# Patient Record
Sex: Female | Born: 1985 | Race: Black or African American | Hispanic: No | Marital: Single | Smoking: Never smoker
Health system: Southern US, Community
[De-identification: ages and names within clinical notes are randomized; demographics above are authoritative.]

## PROBLEM LIST (undated history)

## (undated) DIAGNOSIS — D649 Anemia, unspecified: Secondary | ICD-10-CM

## (undated) HISTORY — PX: EAR CYST EXCISION: SHX22

---

## 2005-03-30 ENCOUNTER — Other Ambulatory Visit: Admission: RE | Admit: 2005-03-30 | Discharge: 2005-03-30 | Payer: Self-pay | Admitting: Family Medicine

## 2006-06-23 ENCOUNTER — Other Ambulatory Visit: Admission: RE | Admit: 2006-06-23 | Discharge: 2006-06-23 | Payer: Self-pay | Admitting: Family Medicine

## 2007-07-10 ENCOUNTER — Other Ambulatory Visit: Admission: RE | Admit: 2007-07-10 | Discharge: 2007-07-10 | Payer: Self-pay | Admitting: Family Medicine

## 2008-09-08 ENCOUNTER — Other Ambulatory Visit: Admission: RE | Admit: 2008-09-08 | Discharge: 2008-09-08 | Payer: Self-pay | Admitting: Family Medicine

## 2009-10-13 ENCOUNTER — Other Ambulatory Visit: Admission: RE | Admit: 2009-10-13 | Discharge: 2009-10-13 | Payer: Self-pay | Admitting: Family Medicine

## 2010-05-12 ENCOUNTER — Other Ambulatory Visit: Admission: RE | Admit: 2010-05-12 | Discharge: 2010-05-12 | Payer: Self-pay | Admitting: Family Medicine

## 2010-10-14 ENCOUNTER — Other Ambulatory Visit: Payer: Self-pay | Admitting: Physician Assistant

## 2010-10-14 ENCOUNTER — Other Ambulatory Visit (HOSPITAL_COMMUNITY)
Admission: RE | Admit: 2010-10-14 | Discharge: 2010-10-14 | Disposition: A | Discharge status: Home / Self Care | Payer: PRIVATE HEALTH INSURANCE | Source: Ambulatory Visit | Attending: Advanced Practice Midwife | Admitting: Advanced Practice Midwife

## 2010-10-14 DIAGNOSIS — Z01419 Encounter for gynecological examination (general) (routine) without abnormal findings: Secondary | ICD-10-CM | POA: Insufficient documentation

## 2011-05-17 ENCOUNTER — Inpatient Hospital Stay (INDEPENDENT_AMBULATORY_CARE_PROVIDER_SITE_OTHER)
Admission: RE | Admit: 2011-05-17 | Discharge: 2011-05-17 | Disposition: A | Discharge status: Home / Self Care | Payer: PRIVATE HEALTH INSURANCE | Source: Ambulatory Visit | Attending: Family Medicine | Admitting: Family Medicine

## 2011-05-17 ENCOUNTER — Ambulatory Visit (INDEPENDENT_AMBULATORY_CARE_PROVIDER_SITE_OTHER): Payer: PRIVATE HEALTH INSURANCE

## 2011-05-17 DIAGNOSIS — M546 Pain in thoracic spine: Secondary | ICD-10-CM

## 2012-07-02 ENCOUNTER — Ambulatory Visit: Payer: PRIVATE HEALTH INSURANCE

## 2012-07-10 ENCOUNTER — Other Ambulatory Visit (HOSPITAL_COMMUNITY)
Admission: RE | Admit: 2012-07-10 | Discharge: 2012-07-10 | Disposition: A | Discharge status: Home / Self Care | Payer: No Typology Code available for payment source | Source: Ambulatory Visit | Attending: Family Medicine | Admitting: Family Medicine

## 2012-07-10 ENCOUNTER — Other Ambulatory Visit: Payer: Self-pay | Admitting: Physician Assistant

## 2012-07-10 DIAGNOSIS — Z124 Encounter for screening for malignant neoplasm of cervix: Secondary | ICD-10-CM | POA: Insufficient documentation

## 2012-07-12 ENCOUNTER — Telehealth: Payer: Self-pay

## 2012-07-12 NOTE — Telephone Encounter (Signed)
Immunizations ready for pickup. Patient notified.

## 2012-07-12 NOTE — Telephone Encounter (Signed)
Pt requesting a copy of her immunization records and would like MR to contact her when ready for pick-up. [phone]

## 2012-09-02 IMAGING — CR DG CHEST 2V
2 series · 2 of 2 positions shown · non-contrast
Comparison: None.

CLINICAL DATA: Chest pain

CHEST - 2 VIEW

[view not recorded (1 of 2)]
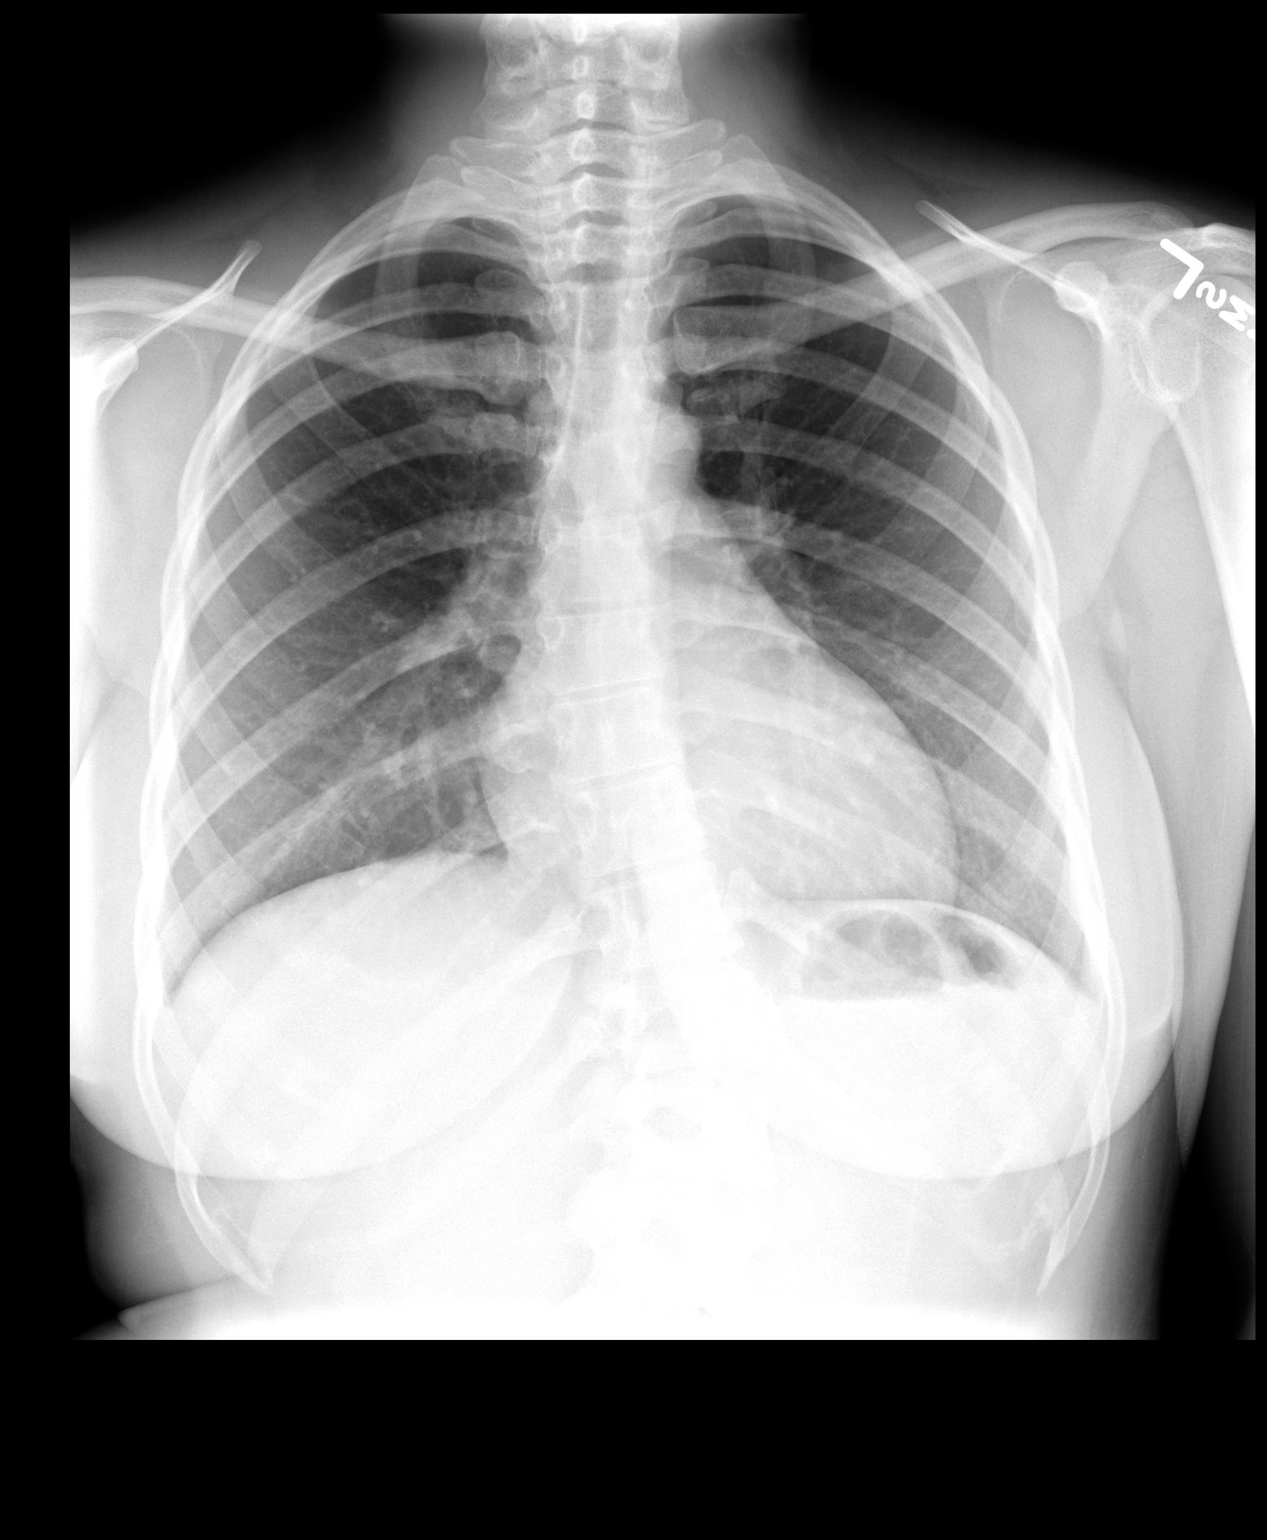

[view not recorded (2 of 2)]
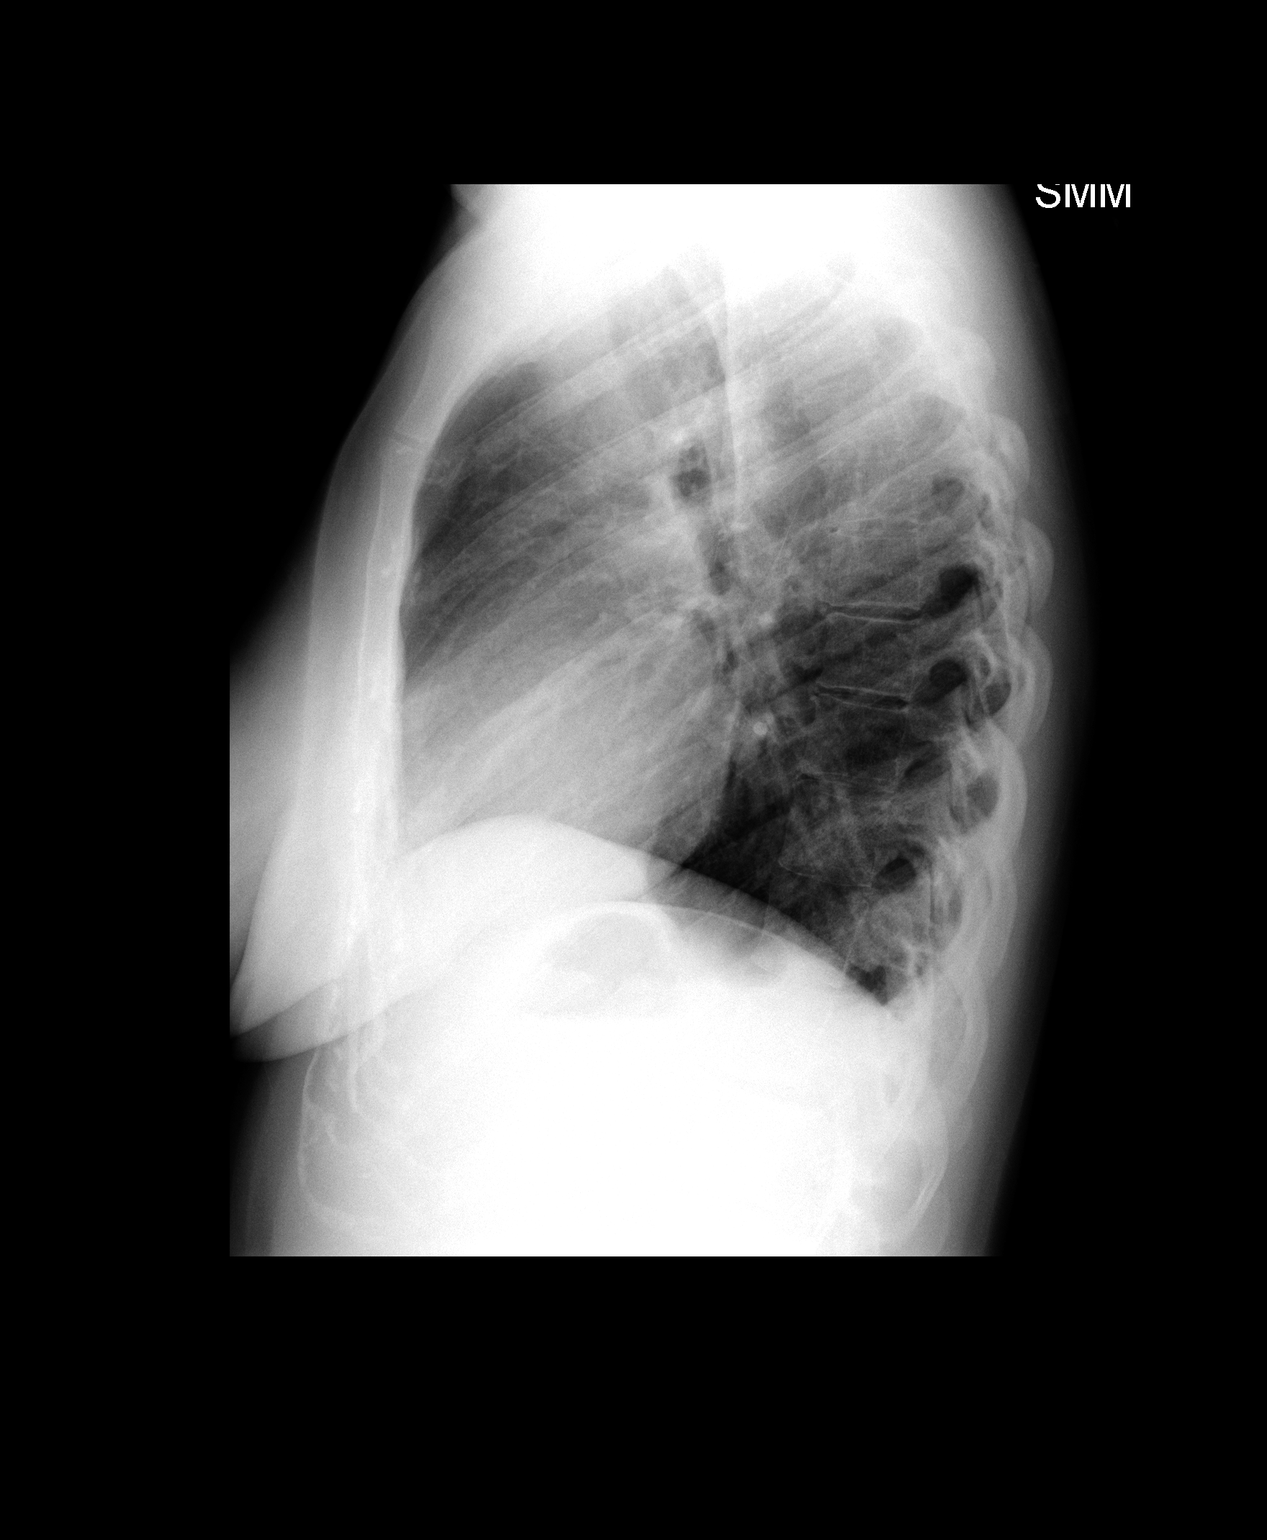

[2 of 2 positions shown; findings below may reference images not displayed]

FINDINGS: Lungs are clear. No pleural effusion or pneumothorax.

The heart is top normal in size.

Mild curvature of the thoracolumbar spine.
IMPRESSION: Normal chest radiographs.

## 2013-07-12 ENCOUNTER — Other Ambulatory Visit (HOSPITAL_COMMUNITY)
Admission: RE | Admit: 2013-07-12 | Discharge: 2013-07-12 | Disposition: A | Discharge status: Home / Self Care | Payer: PRIVATE HEALTH INSURANCE | Source: Ambulatory Visit | Attending: Family Medicine | Admitting: Family Medicine

## 2013-07-12 ENCOUNTER — Other Ambulatory Visit: Payer: Self-pay | Admitting: Physician Assistant

## 2013-07-12 DIAGNOSIS — Z124 Encounter for screening for malignant neoplasm of cervix: Secondary | ICD-10-CM | POA: Insufficient documentation

## 2014-07-17 ENCOUNTER — Other Ambulatory Visit (HOSPITAL_COMMUNITY)
Admission: RE | Admit: 2014-07-17 | Discharge: 2014-07-17 | Disposition: A | Discharge status: Home / Self Care | Payer: 59 | Source: Ambulatory Visit | Attending: Physician Assistant | Admitting: Physician Assistant

## 2014-07-17 ENCOUNTER — Other Ambulatory Visit: Payer: Self-pay | Admitting: Physician Assistant

## 2014-07-17 DIAGNOSIS — Z124 Encounter for screening for malignant neoplasm of cervix: Secondary | ICD-10-CM | POA: Insufficient documentation

## 2014-07-17 DIAGNOSIS — Z1151 Encounter for screening for human papillomavirus (HPV): Secondary | ICD-10-CM | POA: Diagnosis present

## 2014-07-21 LAB — CYTOLOGY - PAP

## 2015-07-21 ENCOUNTER — Other Ambulatory Visit: Payer: Self-pay | Admitting: Physician Assistant

## 2015-07-21 ENCOUNTER — Other Ambulatory Visit (HOSPITAL_COMMUNITY)
Admission: RE | Admit: 2015-07-21 | Discharge: 2015-07-21 | Disposition: A | Discharge status: Home / Self Care | Payer: 59 | Source: Ambulatory Visit | Attending: Family Medicine | Admitting: Family Medicine

## 2015-07-21 DIAGNOSIS — Z124 Encounter for screening for malignant neoplasm of cervix: Secondary | ICD-10-CM | POA: Insufficient documentation

## 2015-07-23 LAB — CYTOLOGY - PAP

## 2016-07-21 ENCOUNTER — Other Ambulatory Visit (HOSPITAL_COMMUNITY)
Admission: RE | Admit: 2016-07-21 | Discharge: 2016-07-21 | Disposition: A | Discharge status: Home / Self Care | Payer: BC Managed Care – PPO | Source: Ambulatory Visit | Attending: Family Medicine | Admitting: Family Medicine

## 2016-07-21 ENCOUNTER — Other Ambulatory Visit: Payer: Self-pay | Admitting: Physician Assistant

## 2016-07-21 DIAGNOSIS — Z124 Encounter for screening for malignant neoplasm of cervix: Secondary | ICD-10-CM | POA: Insufficient documentation

## 2016-07-22 LAB — CYTOLOGY - PAP: Diagnosis: NEGATIVE

## 2019-07-29 ENCOUNTER — Other Ambulatory Visit: Payer: Self-pay | Admitting: Physician Assistant

## 2019-07-29 ENCOUNTER — Other Ambulatory Visit (HOSPITAL_COMMUNITY)
Admission: RE | Admit: 2019-07-29 | Discharge: 2019-07-29 | Disposition: A | Discharge status: Home / Self Care | Payer: BC Managed Care – PPO | Source: Ambulatory Visit | Attending: Physician Assistant | Admitting: Physician Assistant

## 2019-07-29 DIAGNOSIS — Z124 Encounter for screening for malignant neoplasm of cervix: Secondary | ICD-10-CM | POA: Insufficient documentation

## 2019-07-31 LAB — CYTOLOGY - PAP: Diagnosis: NEGATIVE

## 2021-11-09 NOTE — H&P (Incomplete)
Kristi Mullins is an 36 y.o. No obstetric history on file. presenting for ***  There are no problems to display for this patient.   Pertinent Gynecological History: Menses: {menses:16152} Bleeding: {uterine bleeding:32112} Contraception: {contraception:5051} Sexually transmitted diseases: {std risk:32110} Previous GYN Procedures: {previous procedures:3041388}  Last mammogram: {normal/abnormal***:32111} Date: *** Last pap: {normal/abnormal***:32111} Date: *** OB History: No obstetric history on file.   MEDICAL/FAMILY/SOCIAL HX: No LMP recorded.    No past medical history on file.  *** The histories are not reviewed yet. Please review them in the "History" navigator section and refresh this [place].  No family history on file.  Social History:  has no history on file for tobacco use, alcohol use, and drug use.  ALLERGIES/MEDS:  Allergies: Not on File  No medications prior to admission.     Review of Systems  Constitutional: Negative.   HENT: Negative.    Eyes: Negative.   Respiratory: Negative.    Cardiovascular: Negative.   Gastrointestinal: Negative.   Genitourinary: Negative.   Musculoskeletal: Negative.   Skin: Negative.   Neurological: Negative.   Endo/Heme/Allergies: Negative.   Psychiatric/Behavioral: Negative.     There were no vitals taken for this visit. Physical Exam  No results found for this or any previous visit (from the past 24 hour(s)).  No results found.   ASSESSMENT/PLAN: Kristi Mullins is a 36 y.o. No obstetric history on file. who is admitted for [facility], DO [phone] (office)

## 2021-11-18 NOTE — Pre-Procedure Instructions (Signed)
Surgical Instructions ? ? ? Your procedure is scheduled on Tuesday 11/23/21. ? ? Report to [facility] Main Entrance "A" at 05:30 A.M., then check in with the Admitting office. ? Call this number if you have problems the morning of surgery: ? [phone] ? ? If you have any questions prior to your surgery date call [phone]: Open Monday-Friday 8am-4pm ? ? ? Remember: ? Do not eat or drink after midnight the night before your surgery ?  ? Take these medicines the morning of surgery with A SIP OF WATER:  ? NONE ? ?As of today, STOP taking any Aspirin (unless otherwise instructed by your surgeon) Aleve, Naproxen, Ibuprofen, Motrin, Advil, Goody's, BC's, all herbal medications, fish oil, and all vitamins. ? ?         ?Do not wear jewelry or makeup ?Do not wear lotions, powders, perfumes/colognes, or deodorant. ?Do not shave 48 hours prior to surgery.  Men may shave face and neck. ?Do not bring valuables to the hospital. ?Do not wear nail polish, gel polish, artificial nails, or any other type of covering on natural nails (fingers and toes) ?If you have artificial nails or gel coating that need to be removed by a nail salon, please have this removed prior to surgery. Artificial nails or gel coating may interfere with anesthesia's ability to adequately monitor your vital signs. ? ?[facility] is not responsible for any belongings or valuables. .  ? ?Do NOT Smoke (Tobacco/Vaping)  24 hours prior to your procedure ? ?If you use a CPAP at night, you may bring your mask for your overnight stay. ?  ?Contacts, glasses, hearing aids, dentures or partials may not be worn into surgery, please bring cases for these belongings ?  ?For patients admitted to the hospital, discharge time will be determined by your treatment team. ?  ?Patients discharged the day of surgery will not be allowed to drive home, and someone needs to stay with them for 24 hours. ? ?NO VISITORS WILL BE ALLOWED IN PRE-OP WHERE PATIENTS ARE PREPPED FOR  SURGERY.  ONLY 1 SUPPORT PERSON MAY BE PRESENT IN THE WAITING ROOM WHILE YOU ARE IN SURGERY.  IF YOU ARE TO BE ADMITTED, ONCE YOU ARE IN YOUR ROOM YOU WILL BE ALLOWED TWO (2) VISITORS. 1 (ONE) VISITOR MAY STAY OVERNIGHT BUT MUST ARRIVE TO THE ROOM BY 8pm.  Minor children may have two parents present. Special consideration for safety and communication needs will be reviewed on a case by case basis. ? ?Special instructions:   ? ?Oral Hygiene is also important to reduce your risk of infection.  Remember - BRUSH YOUR TEETH THE MORNING OF SURGERY WITH YOUR REGULAR TOOTHPASTE ? ? ?[facility]- Preparing For Surgery ? ?Before surgery, you can play an important role. Because skin is not sterile, your skin needs to be as free of germs as possible. You can reduce the number of germs on your skin by washing with CHG (chlorahexidine gluconate) Soap before surgery.  CHG is an antiseptic cleaner which kills germs and bonds with the skin to continue killing germs even after washing.   ? ? ?Please do not use if you have an allergy to CHG or antibacterial soaps. If your skin becomes reddened/irritated stop using the CHG.  ?Do not shave (including legs and underarms) for at least 48 hours prior to first CHG shower. It is OK to shave your face. ? ?Please follow these instructions carefully. ?  ? ? Shower the [employer] SURGERY and the  MORNING OF SURGERY with CHG Soap.  ? If you chose to wash your hair, wash your hair first as usual with your normal shampoo. After you shampoo, rinse your hair and body thoroughly to remove the shampoo.  Then [employer] and genitals (private parts) with your normal soap and rinse thoroughly to remove soap. ? ?After that Use CHG Soap as you would any other liquid soap. You can apply CHG directly to the skin and wash gently with a scrungie or a clean washcloth.  ? ?Apply the CHG Soap to your body ONLY FROM THE NECK DOWN.  Do not use on open wounds or open sores. Avoid contact with your eyes, ears, mouth  and genitals (private parts). Wash Face and genitals (private parts)  with your normal soap.  ? ?Wash thoroughly, paying special attention to the area where your surgery will be performed. ? ?Thoroughly rinse your body with warm water from the neck down. ? ?DO NOT shower/wash with your normal soap after using and rinsing off the CHG Soap. ? ?Pat yourself dry with a CLEAN TOWEL. ? ?Wear CLEAN PAJAMAS to bed the night before surgery ? ?Place CLEAN SHEETS on your bed the night before your surgery ? ?DO NOT SLEEP WITH PETS. ? ? ?Day of Surgery: ? ?Take a shower with CHG soap. ?Wear Clean/Comfortable clothing the morning of surgery ?Do not apply any deodorants/lotions.   ?Remember to brush your teeth WITH YOUR REGULAR TOOTHPASTE. ? ? ? ?COVID testing ? ?If you are going to stay overnight or be admitted after your procedure/surgery and require a pre-op COVID test, please follow these instructions after your COVID test  ? ?You are not required to quarantine however you are required to wear a well-fitting mask when you are out and around people not in your household.  If your mask becomes wet or soiled, replace with a new one. ? ?Wash your hands often with soap and water for 20 seconds or clean your hands with an alcohol-based hand sanitizer that contains at least 60% alcohol. ? ?Do not share personal items. ? ?Notify your provider: ?if you are in close contact with someone who has COVID  ?or if you develop a fever of 100.4 or greater, sneezing, cough, sore throat, shortness of breath or body aches. ? ?  ?Please read over the following fact sheets that you were given.  ? ?

## 2021-11-19 ENCOUNTER — Encounter (HOSPITAL_COMMUNITY)
Admission: RE | Admit: 2021-11-19 | Discharge: 2021-11-19 | Disposition: A | Discharge status: Home / Self Care | Payer: BC Managed Care – PPO | Source: Ambulatory Visit | Attending: Obstetrics and Gynecology | Admitting: Obstetrics and Gynecology

## 2021-11-19 ENCOUNTER — Other Ambulatory Visit: Payer: Self-pay

## 2021-11-19 ENCOUNTER — Encounter (HOSPITAL_COMMUNITY): Payer: Self-pay

## 2021-11-19 DIAGNOSIS — D250 Submucous leiomyoma of uterus: Secondary | ICD-10-CM | POA: Diagnosis not present

## 2021-11-19 DIAGNOSIS — D251 Intramural leiomyoma of uterus: Secondary | ICD-10-CM | POA: Insufficient documentation

## 2021-11-19 DIAGNOSIS — Z01812 Encounter for preprocedural laboratory examination: Secondary | ICD-10-CM | POA: Insufficient documentation

## 2021-11-19 HISTORY — DX: Anemia, unspecified: D64.9

## 2021-11-19 LAB — CBC
HCT: 33.2 % — ABNORMAL LOW (ref 36.0–46.0)
Hemoglobin: 10.5 g/dL — ABNORMAL LOW (ref 12.0–15.0)
MCH: 27.2 pg (ref 26.0–34.0)
MCHC: 31.6 g/dL (ref 30.0–36.0)
MCV: 86 fL (ref 80.0–100.0)
Platelets: 327 10*3/uL (ref 150–400)
RBC: 3.86 MIL/uL — ABNORMAL LOW (ref 3.87–5.11)
RDW: 14.7 % (ref 11.5–15.5)
WBC: 5.7 10*3/uL (ref 4.0–10.5)
nRBC: 0 % (ref 0.0–0.2)

## 2021-11-19 LAB — TYPE AND SCREEN
ABO/RH(D): O POS
Antibody Screen: NEGATIVE

## 2021-11-19 NOTE — Pre-Procedure Instructions (Signed)
Surgical Instructions ? ? ? Your procedure is scheduled on Tuesday 11/23/21. ? ? Report to [facility] Main Entrance "A" at 05:30 A.M., then check in with the Admitting office. ? Call this number if you have problems the morning of surgery: ? [phone] ? ? If you have any questions prior to your surgery date call [phone]: Open Monday-Friday 8am-4pm ? ? ? Remember: ? Do not eat or drink after midnight the night before your surgery ?  ? Take these medicines the morning of surgery with A SIP OF WATER:  ? ?Sprintec ? ?As of today, STOP taking any Aspirin (unless otherwise instructed by your surgeon) Aleve, Naproxen, Ibuprofen, Motrin, Advil, Goody's, BC's, all herbal medications, fish oil, and all vitamins. ? ?         ?Do not wear jewelry or makeup ?Do not wear lotions, powders, perfumes/colognes, or deodorant. ?Do not shave 48 hours prior to surgery.  Men may shave face and neck. ?Do not bring valuables to the hospital. ?Do not wear nail polish, gel polish, artificial nails, or any other type of covering on natural nails (fingers and toes) ?If you have artificial nails or gel coating that need to be removed by a nail salon, please have this removed prior to surgery. Artificial nails or gel coating may interfere with anesthesia's ability to adequately monitor your vital signs. ? ?[facility] is not responsible for any belongings or valuables. .  ? ?Do NOT Smoke (Tobacco/Vaping)  24 hours prior to your procedure ? ?If you use a CPAP at night, you may bring your mask for your overnight stay. ?  ?Contacts, glasses, hearing aids, dentures or partials may not be worn into surgery, please bring cases for these belongings ?  ?For patients admitted to the hospital, discharge time will be determined by your treatment team. ?  ?Patients discharged the day of surgery will not be allowed to drive home, and someone needs to stay with them for 24 hours. ? ?NO VISITORS WILL BE ALLOWED IN PRE-OP WHERE PATIENTS ARE PREPPED FOR  SURGERY.  ONLY 1 SUPPORT PERSON MAY BE PRESENT IN THE WAITING ROOM WHILE YOU ARE IN SURGERY.  IF YOU ARE TO BE ADMITTED, ONCE YOU ARE IN YOUR ROOM YOU WILL BE ALLOWED TWO (2) VISITORS. 1 (ONE) VISITOR MAY STAY OVERNIGHT BUT MUST ARRIVE TO THE ROOM BY 8pm.  Minor children may have two parents present. Special consideration for safety and communication needs will be reviewed on a case by case basis. ? ?Special instructions:   ? ?Oral Hygiene is also important to reduce your risk of infection.  Remember - BRUSH YOUR TEETH THE MORNING OF SURGERY WITH YOUR REGULAR TOOTHPASTE ? ? ?[facility]- Preparing For Surgery ? ?Before surgery, you can play an important role. Because skin is not sterile, your skin needs to be as free of germs as possible. You can reduce the number of germs on your skin by washing with CHG (chlorahexidine gluconate) Soap before surgery.  CHG is an antiseptic cleaner which kills germs and bonds with the skin to continue killing germs even after washing.   ? ? ?Please do not use if you have an allergy to CHG or antibacterial soaps. If your skin becomes reddened/irritated stop using the CHG.  ?Do not shave (including legs and underarms) for at least 48 hours prior to first CHG shower. It is OK to shave your face. ? ?Please follow these instructions carefully. ?  ? ? Shower the [employer] SURGERY and the  MORNING OF SURGERY with CHG Soap.  ? If you chose to wash your hair, wash your hair first as usual with your normal shampoo. After you shampoo, rinse your hair and body thoroughly to remove the shampoo.  Then [employer] and genitals (private parts) with your normal soap and rinse thoroughly to remove soap. ? ?After that Use CHG Soap as you would any other liquid soap. You can apply CHG directly to the skin and wash gently with a scrungie or a clean washcloth.  ? ?Apply the CHG Soap to your body ONLY FROM THE NECK DOWN.  Do not use on open wounds or open sores. Avoid contact with your eyes, ears, mouth  and genitals (private parts). Wash Face and genitals (private parts)  with your normal soap.  ? ?Wash thoroughly, paying special attention to the area where your surgery will be performed. ? ?Thoroughly rinse your body with warm water from the neck down. ? ?DO NOT shower/wash with your normal soap after using and rinsing off the CHG Soap. ? ?Pat yourself dry with a CLEAN TOWEL. ? ?Wear CLEAN PAJAMAS to bed the night before surgery ? ?Place CLEAN SHEETS on your bed the night before your surgery ? ?DO NOT SLEEP WITH PETS. ? ? ?Day of Surgery: ? ?Take a shower with CHG soap. ?Wear Clean/Comfortable clothing the morning of surgery ?Do not apply any deodorants/lotions.   ?Remember to brush your teeth WITH YOUR REGULAR TOOTHPASTE. ? ? ? ?COVID testing ? ?If you are going to stay overnight or be admitted after your procedure/surgery and require a pre-op COVID test, please follow these instructions after your COVID test  ? ?You are not required to quarantine however you are required to wear a well-fitting mask when you are out and around people not in your household.  If your mask becomes wet or soiled, replace with a new one. ? ?Wash your hands often with soap and water for 20 seconds or clean your hands with an alcohol-based hand sanitizer that contains at least 60% alcohol. ? ?Do not share personal items. ? ?Notify your provider: ?if you are in close contact with someone who has COVID  ?or if you develop a fever of 100.4 or greater, sneezing, cough, sore throat, shortness of breath or body aches. ? ?  ?Please read over the following fact sheets that you were given.  ? ?

## 2021-11-19 NOTE — Progress Notes (Signed)
PCP - Dr. Lennie Odor with [facility] ?Cardiologist - Denies ? ?PPM/ICD - Denies ? ?Chest x-ray - N/A ?EKG - N/A ?Stress Test - Denies ?ECHO - Denies ?Cardiac Cath - Denies ? ?Sleep Study - Denies ? ?Patient denies having diabetes. ? ?Blood Thinner Instructions: N/A ?Aspirin Instructions: N/A ? ?ERAS Protcol - No ? ?COVID TEST- N/A ? ? ?Anesthesia review: No ? ?Patient denies shortness of breath, fever, cough and chest pain at PAT appointment ? ? ?All instructions explained to the patient, with a verbal understanding of the material. Patient agrees to go over the instructions while at home for a better understanding. Patient also instructed to self quarantine after being tested for COVID-19. The opportunity to ask questions was provided. ? ? ?

## 2021-11-21 DIAGNOSIS — N939 Abnormal uterine and vaginal bleeding, unspecified: Secondary | ICD-10-CM

## 2021-11-21 DIAGNOSIS — D259 Leiomyoma of uterus, unspecified: Secondary | ICD-10-CM

## 2021-11-21 DIAGNOSIS — E669 Obesity, unspecified: Secondary | ICD-10-CM

## 2021-11-21 DIAGNOSIS — D509 Iron deficiency anemia, unspecified: Secondary | ICD-10-CM

## 2021-11-23 ENCOUNTER — Observation Stay (HOSPITAL_COMMUNITY): Payer: BC Managed Care – PPO | Admitting: Certified Registered Nurse Anesthetist

## 2021-11-23 ENCOUNTER — Encounter (HOSPITAL_COMMUNITY): Payer: Self-pay | Admitting: Obstetrics and Gynecology

## 2021-11-23 ENCOUNTER — Other Ambulatory Visit: Payer: Self-pay

## 2021-11-23 ENCOUNTER — Inpatient Hospital Stay (HOSPITAL_COMMUNITY)
Admission: RE | Admit: 2021-11-23 | Discharge: 2021-11-24 | DRG: 743 | Disposition: A | Discharge status: Home / Self Care | Payer: BC Managed Care – PPO | Attending: Obstetrics and Gynecology | Admitting: Obstetrics and Gynecology

## 2021-11-23 ENCOUNTER — Encounter (HOSPITAL_COMMUNITY)
Admission: RE | Disposition: A | Discharge status: Home / Self Care | Payer: Self-pay | Source: Home / Self Care | Attending: Obstetrics and Gynecology

## 2021-11-23 DIAGNOSIS — N854 Malposition of uterus: Secondary | ICD-10-CM | POA: Diagnosis present

## 2021-11-23 DIAGNOSIS — D259 Leiomyoma of uterus, unspecified: Secondary | ICD-10-CM

## 2021-11-23 DIAGNOSIS — D250 Submucous leiomyoma of uterus: Principal | ICD-10-CM

## 2021-11-23 DIAGNOSIS — N939 Abnormal uterine and vaginal bleeding, unspecified: Secondary | ICD-10-CM

## 2021-11-23 DIAGNOSIS — D251 Intramural leiomyoma of uterus: Secondary | ICD-10-CM

## 2021-11-23 DIAGNOSIS — D509 Iron deficiency anemia, unspecified: Secondary | ICD-10-CM

## 2021-11-23 DIAGNOSIS — E669 Obesity, unspecified: Secondary | ICD-10-CM | POA: Diagnosis present

## 2021-11-23 DIAGNOSIS — Z6835 Body mass index (BMI) 35.0-35.9, adult: Secondary | ICD-10-CM

## 2021-11-23 HISTORY — PX: MYOMECTOMY: SHX85

## 2021-11-23 LAB — POCT PREGNANCY, URINE: Preg Test, Ur: NEGATIVE

## 2021-11-23 LAB — ABO/RH: ABO/RH(D): O POS

## 2021-11-23 SURGERY — MYOMECTOMY, ABDOMINAL APPROACH
Anesthesia: General | Site: Abdomen

## 2021-11-23 MED ORDER — ONDANSETRON HCL 4 MG PO TABS
4.0000 mg | ORAL_TABLET | Freq: Four times a day (QID) | ORAL | Status: DC | PRN
  Administered 2021-11-24: 4 mg via ORAL
  Filled 2021-11-23: qty 1

## 2021-11-23 MED ORDER — ORAL CARE MOUTH RINSE: 15.0000 mL | Freq: Once | OROMUCOSAL | Status: AC

## 2021-11-23 MED ORDER — HYDROMORPHONE HCL 1 MG/ML IJ SOLN
INTRAMUSCULAR | Status: AC
  Filled 2021-11-23: qty 1

## 2021-11-23 MED ORDER — FENTANYL CITRATE (PF) 250 MCG/5ML IJ SOLN
INTRAMUSCULAR | Status: AC
  Filled 2021-11-23: qty 5

## 2021-11-23 MED ORDER — EPHEDRINE 5 MG/ML INJ
INTRAVENOUS | Status: AC
  Filled 2021-11-23: qty 5

## 2021-11-23 MED ORDER — MORPHINE SULFATE (PF) 2 MG/ML IV SOLN
1.0000 mg | INTRAVENOUS | Status: DC | PRN
  Administered 2021-11-23 (×2): 1 mg via INTRAVENOUS
  Filled 2021-11-23 (×2): qty 1

## 2021-11-23 MED ORDER — OXYCODONE HCL 5 MG PO TABS
10.0000 mg | ORAL_TABLET | ORAL | Status: DC
  Administered 2021-11-23 – 2021-11-24 (×3): 10 mg via ORAL
  Filled 2021-11-23 (×3): qty 2

## 2021-11-23 MED ORDER — DEXMEDETOMIDINE (PRECEDEX) IN NS 20 MCG/5ML (4 MCG/ML) IV SYRINGE
PREFILLED_SYRINGE | INTRAVENOUS | Status: DC | PRN
  Administered 2021-11-23: 8 ug via INTRAVENOUS
  Administered 2021-11-23: 4 ug via INTRAVENOUS

## 2021-11-23 MED ORDER — FENTANYL CITRATE (PF) 100 MCG/2ML IJ SOLN
INTRAMUSCULAR | Status: DC | PRN
  Administered 2021-11-23 (×5): 50 ug via INTRAVENOUS

## 2021-11-23 MED ORDER — OXYCODONE HCL 5 MG PO TABS
5.0000 mg | ORAL_TABLET | Freq: Once | ORAL | Status: AC | PRN
  Administered 2021-11-23: 5 mg via ORAL

## 2021-11-23 MED ORDER — LIDOCAINE 2% (20 MG/ML) 5 ML SYRINGE
INTRAMUSCULAR | Status: AC
  Filled 2021-11-23: qty 5

## 2021-11-23 MED ORDER — KETOROLAC TROMETHAMINE 30 MG/ML IJ SOLN
INTRAMUSCULAR | Status: AC
  Filled 2021-11-23: qty 1

## 2021-11-23 MED ORDER — OXYCODONE HCL 5 MG PO TABS
5.0000 mg | ORAL_TABLET | ORAL | Status: DC | PRN
  Administered 2021-11-23: 10 mg via ORAL
  Filled 2021-11-23: qty 2

## 2021-11-23 MED ORDER — IBUPROFEN 600 MG PO TABS
600.0000 mg | ORAL_TABLET | Freq: Four times a day (QID) | ORAL | Status: DC
  Administered 2021-11-23 – 2021-11-24 (×3): 600 mg via ORAL
  Filled 2021-11-23 (×3): qty 1

## 2021-11-23 MED ORDER — ROCURONIUM BROMIDE 10 MG/ML (PF) SYRINGE
PREFILLED_SYRINGE | INTRAVENOUS | Status: AC
  Filled 2021-11-23: qty 10

## 2021-11-23 MED ORDER — VASOPRESSIN 20 UNIT/ML IV SOLN
INTRAVENOUS | Status: DC | PRN
  Administered 2021-11-23: 101 mL via INTRAMUSCULAR

## 2021-11-23 MED ORDER — MEPERIDINE HCL 25 MG/ML IJ SOLN: 6.2500 mg | INTRAMUSCULAR | Status: DC | PRN

## 2021-11-23 MED ORDER — NORGESTIMATE-ETH ESTRADIOL 0.25-35 MG-MCG PO TABS
1.0000 | ORAL_TABLET | Freq: Every day | ORAL | Status: DC
  Administered 2021-11-23: 1 via ORAL

## 2021-11-23 MED ORDER — SODIUM CHLORIDE (PF) 0.9 % IJ SOLN
INTRAMUSCULAR | Status: AC
  Filled 2021-11-23: qty 50

## 2021-11-23 MED ORDER — LACTATED RINGERS IV SOLN: INTRAVENOUS | Status: DC

## 2021-11-23 MED ORDER — PROMETHAZINE HCL 25 MG/ML IJ SOLN: 6.2500 mg | INTRAMUSCULAR | Status: DC | PRN

## 2021-11-23 MED ORDER — OXYCODONE HCL 5 MG PO TABS
ORAL_TABLET | ORAL | Status: AC
  Filled 2021-11-23: qty 1

## 2021-11-23 MED ORDER — DEXAMETHASONE SODIUM PHOSPHATE 10 MG/ML IJ SOLN
INTRAMUSCULAR | Status: DC | PRN
Start: 2021-11-23 — End: 2021-11-23
  Administered 2021-11-23: 10 mg via INTRAVENOUS

## 2021-11-23 MED ORDER — ONDANSETRON HCL 4 MG/2ML IJ SOLN
4.0000 mg | Freq: Four times a day (QID) | INTRAMUSCULAR | Status: DC | PRN
  Administered 2021-11-24: 4 mg via INTRAVENOUS
  Filled 2021-11-23: qty 2

## 2021-11-23 MED ORDER — MIDAZOLAM HCL 2 MG/2ML IJ SOLN
INTRAMUSCULAR | Status: DC | PRN
  Administered 2021-11-23: 2 mg via INTRAVENOUS

## 2021-11-23 MED ORDER — VASOPRESSIN 20 UNIT/ML IV SOLN
INTRAVENOUS | Status: AC
  Filled 2021-11-23: qty 1

## 2021-11-23 MED ORDER — DEXAMETHASONE SODIUM PHOSPHATE 10 MG/ML IJ SOLN
INTRAMUSCULAR | Status: AC
  Filled 2021-11-23: qty 1

## 2021-11-23 MED ORDER — CEFAZOLIN SODIUM-DEXTROSE 2-4 GM/100ML-% IV SOLN
2.0000 g | INTRAVENOUS | Status: AC
  Administered 2021-11-23: 2 g via INTRAVENOUS
  Filled 2021-11-23: qty 100

## 2021-11-23 MED ORDER — ROCURONIUM BROMIDE 10 MG/ML (PF) SYRINGE
PREFILLED_SYRINGE | INTRAVENOUS | Status: DC | PRN
  Administered 2021-11-23: 20 mg via INTRAVENOUS
  Administered 2021-11-23: 50 mg via INTRAVENOUS

## 2021-11-23 MED ORDER — 0.9 % SODIUM CHLORIDE (POUR BTL) OPTIME
TOPICAL | Status: DC | PRN
  Administered 2021-11-23: 1000 mL

## 2021-11-23 MED ORDER — AMISULPRIDE (ANTIEMETIC) 5 MG/2ML IV SOLN: 10.0000 mg | Freq: Once | INTRAVENOUS | Status: DC | PRN

## 2021-11-23 MED ORDER — PANTOPRAZOLE SODIUM 40 MG IV SOLR
40.0000 mg | Freq: Every day | INTRAVENOUS | Status: DC
  Administered 2021-11-23: 40 mg via INTRAVENOUS
  Filled 2021-11-23: qty 10

## 2021-11-23 MED ORDER — EPHEDRINE SULFATE-NACL 50-0.9 MG/10ML-% IV SOSY
PREFILLED_SYRINGE | INTRAVENOUS | Status: DC | PRN
  Administered 2021-11-23: 10 mg via INTRAVENOUS

## 2021-11-23 MED ORDER — KETOROLAC TROMETHAMINE 30 MG/ML IJ SOLN
INTRAMUSCULAR | Status: DC | PRN
  Administered 2021-11-23: 30 mg via INTRAVENOUS

## 2021-11-23 MED ORDER — ACETAMINOPHEN 10 MG/ML IV SOLN
INTRAVENOUS | Status: AC
  Filled 2021-11-23: qty 100

## 2021-11-23 MED ORDER — LIDOCAINE 2% (20 MG/ML) 5 ML SYRINGE
INTRAMUSCULAR | Status: DC | PRN
  Administered 2021-11-23: 80 mg via INTRAVENOUS

## 2021-11-23 MED ORDER — DOCUSATE SODIUM 100 MG PO CAPS
100.0000 mg | ORAL_CAPSULE | Freq: Two times a day (BID) | ORAL | Status: DC
  Administered 2021-11-23 – 2021-11-24 (×2): 100 mg via ORAL
  Filled 2021-11-23 (×2): qty 1

## 2021-11-23 MED ORDER — ONDANSETRON HCL 4 MG/2ML IJ SOLN
INTRAMUSCULAR | Status: DC | PRN
Start: 2021-11-23 — End: 2021-11-23
  Administered 2021-11-23: 4 mg via INTRAVENOUS

## 2021-11-23 MED ORDER — ACETAMINOPHEN 500 MG PO TABS
1000.0000 mg | ORAL_TABLET | Freq: Four times a day (QID) | ORAL | Status: DC
  Administered 2021-11-23 – 2021-11-24 (×4): 1000 mg via ORAL
  Filled 2021-11-23 (×4): qty 2

## 2021-11-23 MED ORDER — SIMETHICONE 80 MG PO CHEW: 80.0000 mg | CHEWABLE_TABLET | Freq: Four times a day (QID) | ORAL | Status: DC | PRN

## 2021-11-23 MED ORDER — ONDANSETRON HCL 4 MG/2ML IJ SOLN
INTRAMUSCULAR | Status: AC
  Filled 2021-11-23: qty 2

## 2021-11-23 MED ORDER — SUGAMMADEX SODIUM 200 MG/2ML IV SOLN
INTRAVENOUS | Status: DC | PRN
  Administered 2021-11-23: 200 mg via INTRAVENOUS

## 2021-11-23 MED ORDER — OXYCODONE HCL 5 MG/5ML PO SOLN: 5.0000 mg | Freq: Once | ORAL | Status: AC | PRN

## 2021-11-23 MED ORDER — PROPOFOL 10 MG/ML IV BOLUS
INTRAVENOUS | Status: DC | PRN
  Administered 2021-11-23: 200 mg via INTRAVENOUS

## 2021-11-23 MED ORDER — CHLORHEXIDINE GLUCONATE 0.12 % MT SOLN
15.0000 mL | Freq: Once | OROMUCOSAL | Status: AC
  Administered 2021-11-23: 15 mL via OROMUCOSAL
  Filled 2021-11-23: qty 15

## 2021-11-23 MED ORDER — ACETAMINOPHEN 10 MG/ML IV SOLN
INTRAVENOUS | Status: DC | PRN
  Administered 2021-11-23: 1000 mg via INTRAVENOUS

## 2021-11-23 MED ORDER — MIDAZOLAM HCL 2 MG/2ML IJ SOLN
INTRAMUSCULAR | Status: AC
  Filled 2021-11-23: qty 2

## 2021-11-23 MED ORDER — SODIUM CHLORIDE (PF) 0.9 % IJ SOLN
INTRAMUSCULAR | Status: AC
  Filled 2021-11-23: qty 100

## 2021-11-23 MED ORDER — HYDROMORPHONE HCL 1 MG/ML IJ SOLN
0.2500 mg | INTRAMUSCULAR | Status: DC | PRN
  Administered 2021-11-23 (×4): 0.5 mg via INTRAVENOUS

## 2021-11-23 SURGICAL SUPPLY — 44 items
BARRIER ADHS 3X4 INTERCEED (GAUZE/BANDAGES/DRESSINGS) ×2 IMPLANT
BENZOIN TINCTURE PRP APPL 2/3 (GAUZE/BANDAGES/DRESSINGS) ×1 IMPLANT
CANISTER SUCT 3000ML PPV (MISCELLANEOUS) ×2 IMPLANT
CLSR STERI-STRIP ANTIMIC 1/2X4 (GAUZE/BANDAGES/DRESSINGS) ×2 IMPLANT
DECANTER SPIKE VIAL GLASS SM (MISCELLANEOUS) ×2 IMPLANT
DRAPE CESAREAN BIRTH W POUCH (DRAPES) ×2 IMPLANT
DRSG OPSITE POSTOP 4X10 (GAUZE/BANDAGES/DRESSINGS) ×2 IMPLANT
DRSG OPSITE POSTOP 4X8 (GAUZE/BANDAGES/DRESSINGS) ×1 IMPLANT
GAUZE 4X4 16PLY ~~LOC~~+RFID DBL (SPONGE) IMPLANT
GLOVE SURG ENC MOIS LTX SZ6.5 (GLOVE) ×4 IMPLANT
GLOVE SURG UNDER POLY LF SZ6.5 (GLOVE) ×2 IMPLANT
GLOVE SURG UNDER POLY LF SZ7 (GLOVE) ×2 IMPLANT
GOWN STRL REUS W/ TWL LRG LVL3 (GOWN DISPOSABLE) ×2 IMPLANT
GOWN STRL REUS W/TWL LRG LVL3 (GOWN DISPOSABLE) ×4
HEMOSTAT ARISTA ABSORB 3G PWDR (HEMOSTASIS) IMPLANT
KIT TURNOVER KIT B (KITS) ×2 IMPLANT
NEEDLE HYPO 22GX1.5 SAFETY (NEEDLE) ×2 IMPLANT
NS IRRIG 1000ML POUR BTL (IV SOLUTION) ×2 IMPLANT
PACK ABDOMINAL GYN (CUSTOM PROCEDURE TRAY) ×2 IMPLANT
PAD ARMBOARD 7.5X6 YLW CONV (MISCELLANEOUS) ×2 IMPLANT
PAD OB MATERNITY 4.3X12.25 (PERSONAL CARE ITEMS) ×2 IMPLANT
PENCIL SMOKE EVACUATOR (MISCELLANEOUS) ×2 IMPLANT
RTRCTR C-SECT PINK 25CM LRG (MISCELLANEOUS) ×1 IMPLANT
SPECIMEN JAR MEDIUM (MISCELLANEOUS) ×2 IMPLANT
STAPLER VISISTAT 35W (STAPLE) IMPLANT
SUT MNCRL 0 VIOLET 6X18 (SUTURE) IMPLANT
SUT MNCRL+ AB 3-0 CT1 36 (SUTURE) IMPLANT
SUT MON AB 2-0 CT1 27 (SUTURE) IMPLANT
SUT MON AB 3-0 SH 27 (SUTURE) ×14
SUT MON AB 3-0 SH27 (SUTURE) IMPLANT
SUT MONOCRYL 0 6X18 (SUTURE)
SUT MONOCRYL AB 3-0 CT1 36IN (SUTURE) ×2
SUT PDS AB 0 CT 36 (SUTURE) ×2 IMPLANT
SUT PDS AB 0 CT1 27 (SUTURE) IMPLANT
SUT VIC AB 0 CT1 18XCR BRD8 (SUTURE) ×4 IMPLANT
SUT VIC AB 0 CT1 8-18 (SUTURE) ×8
SUT VIC AB 2-0 CT1 27 (SUTURE) ×2
SUT VIC AB 2-0 CT1 TAPERPNT 27 (SUTURE) IMPLANT
SUT VIC AB 4-0 KS 27 (SUTURE) ×1 IMPLANT
SUT VIC AB 4-0 SH 27 (SUTURE)
SUT VIC AB 4-0 SH 27XANBCTRL (SUTURE) IMPLANT
SYR CONTROL 10ML LL (SYRINGE) ×2 IMPLANT
TOWEL GREEN STERILE FF (TOWEL DISPOSABLE) ×4 IMPLANT
TRAY FOLEY W/BAG SLVR 14FR (SET/KITS/TRAYS/PACK) ×2 IMPLANT

## 2021-11-23 NOTE — Anesthesia Procedure Notes (Signed)
Procedure Name: Intubation ?Date/Time: 11/23/2021 7:41 AM ?Performed by: Genelle Bal, CRNA ?Pre-anesthesia Checklist: Patient identified, Emergency Drugs available, Suction available and Patient being monitored ?Patient Re-evaluated:Patient Re-evaluated prior to induction ?Oxygen Delivery Method: Circle system utilized ?Preoxygenation: Pre-oxygenation with 100% oxygen ?Induction Type: IV induction ?Ventilation: Mask ventilation without difficulty ?Laryngoscope Size: Sabra Heck and 2 ?Grade View: Grade I ?Tube type: Oral ?Tube size: 7.0 mm ?Number of attempts: 1 ?Airway Equipment and Method: Stylet and Oral airway ?Placement Confirmation: ETT inserted through vocal cords under direct vision, positive ETCO2 and breath sounds checked- equal and bilateral ?Secured at: 21 cm ?Tube secured with: Tape ?Dental Injury: Teeth and Oropharynx as per pre-operative assessment  ? ? ? ? ?

## 2021-11-23 NOTE — Interval H&P Note (Signed)
History and Physical Interval Note: ? ?11/23/2021 ?7:25 AM ? ?Kristi Mullins  has presented today for surgery, with the diagnosis of Fibroids.  The various methods of treatment have been discussed with the patient and family. After consideration of risks, benefits and other options for treatment, the patient has consented to  Procedure(s): ?ABDOMINAL MYOMECTOMY (N/A) as a surgical intervention.  The patient's history has been reviewed, patient examined, no change in status, stable for surgery.  I have reviewed the patient's chart and labs.  Questions were answered to the patient's satisfaction.   ? ? ?Drema Dallas ? ? ?

## 2021-11-23 NOTE — Anesthesia Preprocedure Evaluation (Signed)
Anesthesia Evaluation  ?Patient identified by MRN, date of birth, ID band ?Patient awake ? ? ? ?Reviewed: ?Allergy & Precautions, NPO status , Patient's Chart, lab work & pertinent test results ? ?Airway ?Mallampati: II ? ?TM Distance: >3 FB ?Neck ROM: Full ? ? ? Dental ?no notable dental hx. ? ?  ?Pulmonary ?neg pulmonary ROS,  ?  ?Pulmonary exam normal ?breath sounds clear to auscultation ? ? ? ? ? ? Cardiovascular ?negative cardio ROS ?Normal cardiovascular exam ?Rhythm:Regular Rate:Normal ? ? ?  ?Neuro/Psych ?negative neurological ROS ? negative psych ROS  ? GI/Hepatic ?negative GI ROS, Neg liver ROS,   ?Endo/Other  ?negative endocrine ROS ? Renal/GU ?negative Renal ROS  ?negative genitourinary ?  ?Musculoskeletal ?negative musculoskeletal ROS ?(+)  ? Abdominal ?(+) + obese,   ?Peds ?negative pediatric ROS ?(+)  Hematology ? ?(+) Blood dyscrasia, anemia ,   ?Anesthesia Other Findings ? ? Reproductive/Obstetrics ?negative OB ROS ? ?  ? ? ? ? ? ? ? ? ? ? ? ? ? ?  ?  ? ? ? ? ? ? ? ? ?Anesthesia Physical ?Anesthesia Plan ? ?ASA: 2 ? ?Anesthesia Plan: General  ? ?Post-op Pain Management: Dilaudid IV  ? ?Induction: Intravenous ? ?PONV Risk Score and Plan: 3 and Ondansetron, Dexamethasone, Midazolam and Treatment may vary due to age or medical condition ? ?Airway Management Planned: Oral ETT ? ?Additional Equipment:  ? ?Intra-op Plan:  ? ?Post-operative Plan: Extubation in OR ? ?Informed Consent: I have reviewed the patients History and Physical, chart, labs and discussed the procedure including the risks, benefits and alternatives for the proposed anesthesia with the patient or authorized representative who has indicated his/her understanding and acceptance.  ? ? ? ?Dental advisory given ? ?Plan Discussed with: CRNA ? ?Anesthesia Plan Comments:   ? ? ? ? ? ? ?Anesthesia Quick Evaluation ? ?

## 2021-11-23 NOTE — Anesthesia Postprocedure Evaluation (Signed)
Anesthesia Post Note ? ?Patient: Kristi Mullins ? ?Procedure(s) Performed: ABDOMINAL MYOMECTOMY (Abdomen) ? ?  ? ?Patient location during evaluation: PACU ?Anesthesia Type: General ?Level of consciousness: awake and alert ?Pain management: pain level controlled ?Vital Signs Assessment: post-procedure vital signs reviewed and stable ?Respiratory status: spontaneous breathing, nonlabored ventilation and respiratory function stable ?Cardiovascular status: blood pressure returned to baseline and stable ?Postop Assessment: no apparent nausea or vomiting ?Anesthetic complications: no ? ? ?No notable events documented. ? ?Last Vitals:  ?Vitals:  ? 11/23/21 1040 11/23/21 1055  ?BP: 108/70 107/72  ?Pulse: 90 80  ?Resp: 13 12  ?Temp: 36.9 ?C   ?SpO2: 97% 97%  ?  ?Last Pain:  ?Vitals:  ? 11/23/21 1055  ?TempSrc:   ?PainSc: 4   ? ? ?  ?  ?  ?  ?  ?  ? ?Lynda Rainwater ? ? ? ? ?

## 2021-11-23 NOTE — Op Note (Addendum)
Pre Op Dx:   ?1. Abnormal uterine bleeding ?2. Enlarged, fibroid uterus ? ?Post Op Dx:   ?Same as pre-operative diagnoses ? ?Procedure:   ?Abdominal Myomectomy ?  ?Surgeon:  Dr. Drema Dallas ?Assistants:  Dr. Christophe Louis (assistant needed due to the complexity of the anatomy) ?Anesthesia:  General ?  ?EBL:  100cc  ?IVF:  1200cc ?UOP:  550cc ?  ?Drains:  Foley catheter ?Specimen removed:  Multiple fibroids (8 total) - sent to pathology ?Device(s) implanted: None ?Case Type:  Clean-contaminated ?Findings:  Enlarged ~16cm fibroid uterus. Normal-appearing bilateral fallopian tubes and ovaries. ?Complications: None ? ?Indications:  36 y.o. G0 with enlarged, symptomatic fibroid uterus who desired abdominal myomectomy for management. Declines hysterectomy despite no desire for future fertility. ? ?Description of each procedure:   ?After informed consent was obtained, the patient was taken to the operating room in supine position.  After administration of general anesthesia, she was prepped and draped in the usual sterile fashion.  A foley catheter was placed.  A pre-operative time-out was performed.  The abdomen was entered using a pfannenstiel incision. Peritoneum was entered sharply and an Chiropodist was placed.  The uterus was brought through the incision and the fibroids were identified.  A dilute solution of Pitressin was used prior to incising the uterus to enucleate the fibroids.  The fibroids were removed with blunt and sharp dissection - a total of 8 were removed. It was noted that the endometrial cavity was entered posteriorly. The endometrium on the left posterior incision was closed using 3-0 Monocryl. The deep layers of the myometrium were closed using 0-Vicryl in two layers. The superficial layers were closed using a baseball stitch with 3-0 Monocryl. Interceed placed on the four large myometrial incision sites. The subfascial spaces and rectus muscles were inspected and were hemostatic.  The peritoneum  was closed using 2-0 Vicryl.  The fascia was closed using 0-PDS.  The subcutaneous tissues were closed using 3-0 Monocryl.  The skin was closed with 4-0 Vicryl.  The incision was dressed with steri strips and a honeycomb dressing.  All counts were correct x 2.  The patient was awakened in stable condition and appeared to have tolerated the procedure well.   ? ?Disposition:  PACU ? ?Comments: Due to the extensive nature of myomectomy, recommend cesarean section if the patient ever has future pregnancy. ? ?Drema Dallas, DO ? ?

## 2021-11-23 NOTE — Transfer of Care (Signed)
Immediate Anesthesia Transfer of Care Note ? ?Patient: Kristi Mullins ? ?Procedure(s) Performed: ABDOMINAL MYOMECTOMY (Abdomen) ? ?Patient Location: PACU ? ?Anesthesia Type:General ? ?Level of Consciousness: awake, alert  and oriented ? ?Airway & Oxygen Therapy: Patient Spontanous Breathing and Patient connected to face mask oxygen ? ?Post-op Assessment: Report given to RN and Post -op Vital signs reviewed and stable ? ?Post vital signs: Reviewed and stable ? ?Last Vitals:  ?Vitals Value Taken Time  ?BP 117/71 11/23/21 1008  ?Temp    ?Pulse 87 11/23/21 1011  ?Resp 15 11/23/21 1011  ?SpO2 98 % 11/23/21 1011  ?Vitals shown include unvalidated device data. ? ?Last Pain:  ?Vitals:  ? 11/23/21 0628  ?TempSrc:   ?PainSc: 0-No pain  ?   ? ?  ? ?Complications: No notable events documented. ?

## 2021-11-23 NOTE — Progress Notes (Signed)
GYN Post-Op Note ? ?Subjective:  Patient is doing well. Reports incisional pain which eat packs are helping the most. Tolerating clears so far. Has not ambulated yet. Foley in place. Denies fevers, chills, chest pain, SOB, N/V, or bilateral LE edema. ? ?Objective: ?BP 114/67 (BP Location: Right Arm)   Pulse 68   Temp 98.3 ?F (36.8 ?C) (Oral)   Resp 18   Ht '4\' 11"'$  (1.499 m)   Wt 83.9 kg   LMP 10/31/2021 Comment: DOS UPREG NEGATIVE  SpO2 100%   BMI 37.37 kg/m?  ?Gen:  NAD, pleasant and cooperative ?Cardio:  RRR ?Pulm:  CTAB, no wheezes/rales/rhonchi ?Abd:  Soft, non-distended, appropriately tender throughout, no rebound/guarding, honeycomb dressing with minimal saturation and is intact ?Ext:  No bilateral LE edema, no bilateral calf tenderness, SCDs on an working ? ?Foley: ~400cc yellow urine ? ?UOP: >100cc/hour ? ?A/P: ?POD#0 s/p Abdominal Myomectomy ? ?- Doing well post-operatively ?- Pulm:  RA ?- GI:  DAT  Prophylaxis: Pepcid  ?- Renal:  Foley in place - okay to remove tonight at 1800, if patient not ambulating okay to remove at 0530 tomorrow ?- IVF:  LR at 125cc/hour ?- Pain:  Roxi/Motrin/Tylenol, Morphine PRN ?- DVT Prophylaxis:  SCDs ?- Activity:  Ad lib ?- Labs:  CBC ordered for AM ? ? ?Drema Dallas, DO ? ?

## 2021-11-24 ENCOUNTER — Encounter (HOSPITAL_COMMUNITY): Payer: Self-pay | Admitting: Obstetrics and Gynecology

## 2021-11-24 DIAGNOSIS — Z6835 Body mass index (BMI) 35.0-35.9, adult: Secondary | ICD-10-CM | POA: Diagnosis not present

## 2021-11-24 DIAGNOSIS — N854 Malposition of uterus: Secondary | ICD-10-CM | POA: Diagnosis present

## 2021-11-24 DIAGNOSIS — D250 Submucous leiomyoma of uterus: Secondary | ICD-10-CM | POA: Diagnosis present

## 2021-11-24 DIAGNOSIS — D509 Iron deficiency anemia, unspecified: Secondary | ICD-10-CM | POA: Diagnosis present

## 2021-11-24 DIAGNOSIS — D259 Leiomyoma of uterus, unspecified: Secondary | ICD-10-CM | POA: Diagnosis present

## 2021-11-24 DIAGNOSIS — E669 Obesity, unspecified: Secondary | ICD-10-CM | POA: Diagnosis present

## 2021-11-24 LAB — CBC
HCT: 27.9 % — ABNORMAL LOW (ref 36.0–46.0)
Hemoglobin: 9.1 g/dL — ABNORMAL LOW (ref 12.0–15.0)
MCH: 27.7 pg (ref 26.0–34.0)
MCHC: 32.6 g/dL (ref 30.0–36.0)
MCV: 84.8 fL (ref 80.0–100.0)
Platelets: 251 10*3/uL (ref 150–400)
RBC: 3.29 MIL/uL — ABNORMAL LOW (ref 3.87–5.11)
RDW: 14.6 % (ref 11.5–15.5)
WBC: 15.6 10*3/uL — ABNORMAL HIGH (ref 4.0–10.5)
nRBC: 0 % (ref 0.0–0.2)

## 2021-11-24 LAB — SURGICAL PATHOLOGY

## 2021-11-24 MED ORDER — OXYCODONE HCL 10 MG PO TABS: 10.0000 mg | ORAL_TABLET | Freq: Four times a day (QID) | ORAL | 0 refills | Status: AC | PRN

## 2021-11-24 MED ORDER — IBUPROFEN 600 MG PO TABS
600.0000 mg | ORAL_TABLET | Freq: Four times a day (QID) | ORAL | 0 refills | Status: AC
Start: 2021-11-24 — End: ?

## 2021-11-24 NOTE — Plan of Care (Signed)
?  Problem: Education: Goal: Knowledge of General Education information will improve Description: Including pain rating scale, medication(s)/side effects and non-pharmacologic comfort measures Outcome: Adequate for Discharge   Problem: Health Behavior/Discharge Planning: Goal: Ability to manage health-related needs will improve Outcome: Adequate for Discharge   Problem: Clinical Measurements: Goal: Ability to maintain clinical measurements within normal limits will improve Outcome: Adequate for Discharge Goal: Will remain free from infection Outcome: Adequate for Discharge Goal: Diagnostic test results will improve Outcome: Adequate for Discharge Goal: Respiratory complications will improve Outcome: Adequate for Discharge Goal: Cardiovascular complication will be avoided Outcome: Adequate for Discharge   Problem: Activity: Goal: Risk for activity intolerance will decrease Outcome: Adequate for Discharge   Problem: Nutrition: Goal: Adequate nutrition will be maintained Outcome: Adequate for Discharge   Problem: Coping: Goal: Level of anxiety will decrease Outcome: Adequate for Discharge   Problem: Elimination: Goal: Will not experience complications related to bowel motility Outcome: Adequate for Discharge Goal: Will not experience complications related to urinary retention Outcome: Adequate for Discharge   Problem: Pain Managment: Goal: General experience of comfort will improve Outcome: Adequate for Discharge   Problem: Safety: Goal: Ability to remain free from injury will improve Outcome: Adequate for Discharge   Problem: Skin Integrity: Goal: Risk for impaired skin integrity will decrease Outcome: Adequate for Discharge   Problem: Education: Goal: Knowledge of the prescribed therapeutic regimen will improve Outcome: Adequate for Discharge Goal: Understanding of sexual limitations or changes related to disease process or condition will improve Outcome: Adequate  for Discharge Goal: Individualized Educational Video(s) Outcome: Adequate for Discharge   Problem: Self-Concept: Goal: Communication of feelings regarding changes in body function or appearance will improve Outcome: Adequate for Discharge   Problem: Skin Integrity: Goal: Demonstration of wound healing without infection will improve Outcome: Adequate for Discharge   

## 2021-11-24 NOTE — Discharge Summary (Signed)
Physician Discharge Summary  ?Patient ID: ?Mcarthur Rossetti ?MRN: [id number] ?DOB/AGE: 1986/08/16 36 y.o. ? ?Admit date: 11/23/2021 ?Discharge date: 11/24/2021 ? ?Admission Diagnoses: ?Abnormal uterine bleeding (AUB) ?Fibroid uterus ?Iron deficiency anemia ? ?Discharge Diagnoses:  ?Principal Problem: ?  Fibroid uterus ?Active Problems: ?  Abnormal uterine bleeding (AUB) ?  Obesity (BMI 35.0-39.9 without comorbidity) ?  Iron deficiency anemia ? ?Procedure(s): Abdominal Myomectomy ? ?Discharged Condition: good ? ?Hospital Course: Patient was admitted on 11/23/2021 for the above named procedure(s) for the above named diagnoses. Prior to hospital discharge, patient was tolerating PO, ambulating, voiding spontaneously, passing flatus, and pain was well-controlled. See hospital chart for specific details. Patient was discharged home in stable condition. ? ?Consults: None ? ?Significant Diagnostic Studies: labs:  ?CBC Latest Ref Rng & Units 11/24/2021 11/19/2021  ?WBC 4.0 - 10.5 K/uL 15.6(H) 5.7  ?Hemoglobin 12.0 - 15.0 g/dL 9.1(L) 10.5(L)  ?Hematocrit 36.0 - 46.0 % 27.9(L) 33.2(L)  ?Platelets 150 - 400 K/uL 251 327  ? ? ?Treatments: surgery: See above ? ?Discharge Exam: ?Blood pressure (!) 96/56, pulse 75, temperature 98 ?F (36.7 ?C), temperature source Oral, resp. rate 16, height '4\' 11"'$  (1.499 m), weight 83.9 kg, last menstrual period 10/31/2021, SpO2 100 %. ?Gen:  NAD, pleasant and cooperative ?Pulm: No increased work of breathing ?Abd:  Soft, non-distended, non-tender throughout, no rebound/guarding, honeycomb c/d/i ?Ext:  No bilateral LE edema, no bilateral calf tenderness ? ? ?No results found. ? ? ? ?Allergies as of 11/24/2021   ?No Known Allergies ?  ? ?  ?Medication List  ?  ? ?STOP taking these medications   ? ?naproxen sodium 220 MG tablet ?Commonly known as: ALEVE ?  ? ?  ? ?TAKE these medications   ? ?Ferrex 150 150 MG capsule ?Generic drug: iron polysaccharides ?Take 150 mg by mouth daily. ?  ?ibuprofen 600 MG  tablet ?Commonly known as: ADVIL ?Take 1 tablet (600 mg total) by mouth every 6 (six) hours. ?  ?Oxycodone HCl 10 MG Tabs ?Take 1 tablet (10 mg total) by mouth every 6 (six) hours as needed for severe pain, breakthrough pain or moderate pain. ?  ?Sprintec 28 0.25-35 MG-MCG tablet ?Generic drug: norgestimate-ethinyl estradiol ?Take 1 tablet by mouth daily. ?  ? ?  ? ? Follow-up Information   ? ? Drema Dallas, DO Follow up in 2 week(s).   ?Specialty: Obstetrics and Gynecology ?Why: Please keep your 2 week post-operative follow-up. ?Contact information: ?[address] ?[address] ?[place] [address] ?[phone] ? ? ?  ?  ? ?  ?  ? ?  ? ? ?Signed: ?Drema Dallas ?11/24/2021, 7:40 AM ? ? ?

## 2021-11-24 NOTE — Progress Notes (Signed)
Discharge instructions given, all questions answered, and patient verbalized understanding. Patient is alert and oriented x4, ambulatory, and states pain tolerable. Follow-up appointment, medications, recovery, and when to call the provider reviewed.  ?
